# Patient Record
Sex: Male | Born: 1990 | Race: White | Hispanic: No | Marital: Single | Smoking: Never smoker
Health system: Southern US, Community
[De-identification: ages and names within clinical notes are randomized; demographics above are authoritative.]

## PROBLEM LIST (undated history)

## (undated) DIAGNOSIS — Z87898 Personal history of other specified conditions: Secondary | ICD-10-CM

## (undated) DIAGNOSIS — Z8673 Personal history of transient ischemic attack (TIA), and cerebral infarction without residual deficits: Secondary | ICD-10-CM

## (undated) DIAGNOSIS — Z8489 Family history of other specified conditions: Secondary | ICD-10-CM

## (undated) DIAGNOSIS — Z8661 Personal history of infections of the central nervous system: Secondary | ICD-10-CM

## (undated) DIAGNOSIS — L559 Sunburn, unspecified: Secondary | ICD-10-CM

## (undated) DIAGNOSIS — J353 Hypertrophy of tonsils with hypertrophy of adenoids: Secondary | ICD-10-CM

## (undated) HISTORY — PX: WISDOM TOOTH EXTRACTION: SHX21

---

## 2015-02-04 ENCOUNTER — Encounter (HOSPITAL_BASED_OUTPATIENT_CLINIC_OR_DEPARTMENT_OTHER): Payer: Self-pay | Admitting: *Deleted

## 2015-02-04 ENCOUNTER — Emergency Department (HOSPITAL_BASED_OUTPATIENT_CLINIC_OR_DEPARTMENT_OTHER): Payer: BLUE CROSS/BLUE SHIELD

## 2015-02-04 ENCOUNTER — Emergency Department (HOSPITAL_BASED_OUTPATIENT_CLINIC_OR_DEPARTMENT_OTHER)
Admission: EM | Admit: 2015-02-04 | Discharge: 2015-02-04 | Disposition: A | Discharge status: Home / Self Care | Payer: BLUE CROSS/BLUE SHIELD | Attending: Emergency Medicine | Admitting: Emergency Medicine

## 2015-02-04 DIAGNOSIS — J36 Peritonsillar abscess: Secondary | ICD-10-CM | POA: Insufficient documentation

## 2015-02-04 DIAGNOSIS — R52 Pain, unspecified: Secondary | ICD-10-CM

## 2015-02-04 DIAGNOSIS — J029 Acute pharyngitis, unspecified: Secondary | ICD-10-CM | POA: Diagnosis present

## 2015-02-04 MED ORDER — ONDANSETRON HCL 4 MG/2ML IJ SOLN
4.0000 mg | Freq: Once | INTRAMUSCULAR | Status: AC
  Administered 2015-02-04: 4 mg via INTRAVENOUS
  Filled 2015-02-04: qty 2

## 2015-02-04 MED ORDER — CLINDAMYCIN HCL 300 MG PO CAPS: 300.0000 mg | ORAL_CAPSULE | Freq: Four times a day (QID) | ORAL | Status: DC

## 2015-02-04 MED ORDER — NAPROXEN 500 MG PO TABS: ORAL_TABLET | ORAL | Status: DC

## 2015-02-04 MED ORDER — KETOROLAC TROMETHAMINE 30 MG/ML IJ SOLN
30.0000 mg | Freq: Once | INTRAMUSCULAR | Status: AC
  Administered 2015-02-04: 30 mg via INTRAVENOUS
  Filled 2015-02-04: qty 1

## 2015-02-04 MED ORDER — DEXAMETHASONE SODIUM PHOSPHATE 10 MG/ML IJ SOLN
20.0000 mg | Freq: Once | INTRAMUSCULAR | Status: AC
  Administered 2015-02-04: 20 mg via INTRAVENOUS
  Filled 2015-02-04: qty 2

## 2015-02-04 MED ORDER — CLINDAMYCIN PHOSPHATE 900 MG/50ML IV SOLN
900.0000 mg | Freq: Once | INTRAVENOUS | Status: AC
  Administered 2015-02-04: 900 mg via INTRAVENOUS
  Filled 2015-02-04: qty 50

## 2015-02-04 NOTE — ED Notes (Signed)
Dr. Palumbo at [facility] 

## 2015-02-04 NOTE — ED Notes (Signed)
Returns from xray, did fine standing per xray staff, VSS/improved, "feel better".

## 2015-02-04 NOTE — ED Provider Notes (Signed)
648 on reassessment no shift of the uvula.  Left tonsil  Has retracted approximately 25%.  Widely patent airway.  PO challenged successfully .  Stable for discharge  Steffie Waggoner, MD 02/04/15 [phone]

## 2015-02-04 NOTE — ED Provider Notes (Signed)
CSN: [id number]     Arrival date & time 02/04/15  4098 History   First MD Initiated Contact with Patient 02/04/15 0500     Chief Complaint  Patient presents with  . Sore Throat     (Consider location/radiation/quality/duration/timing/severity/associated sxs/prior Treatment) Patient is a 24 y.o. male presenting with pharyngitis. The history is provided by the patient.  Sore Throat This is a new problem. The current episode started yesterday. The problem occurs constantly. The problem has not changed since onset.Pertinent negatives include no chest pain, no abdominal pain, no headaches and no shortness of breath. Nothing aggravates the symptoms. Nothing relieves the symptoms. He has tried nothing for the symptoms. The treatment provided no relief.  Apparently, something similar happened 2 months ago and patient was seen at urgent care and described amoxicillin.  No difficulty opening the mouth, nor swallowing no handling own secretions.    History reviewed. No pertinent past medical history. History reviewed. No pertinent past surgical history. History reviewed. No pertinent family history. History  Substance Use Topics  . Smoking status: Never Smoker   . Smokeless tobacco: Not on file  . Alcohol Use: No    Review of Systems  HENT: Positive for sore throat and voice change. Negative for dental problem, drooling and facial swelling.   Respiratory: Negative for shortness of breath.   Cardiovascular: Negative for chest pain.  Gastrointestinal: Negative for abdominal pain.  Neurological: Negative for headaches.  All other systems reviewed and are negative.     Allergies  Sulfa antibiotics  Home Medications   Prior to Admission medications   Not on File   BP 131/69 mmHg  Pulse 99  Temp(Src) 99.4 F (37.4 C) (Oral)  Resp 18  Ht 6' (1.829 m)  Wt 211 lb (95.709 kg)  BMI 28.61 kg/m2  SpO2 99% Physical Exam  Constitutional: He is oriented to person, place, and time. He  appears well-developed and well-nourished.  HENT:  Head: Normocephalic and atraumatic.  Mouth/Throat: No trismus in the jaw. Uvula swelling present. Tonsillar abscesses present.  Eyes: Conjunctivae and EOM are normal. Pupils are equal, round, and reactive to light.  Neck: Normal range of motion. Neck supple. No tracheal deviation present.  No pain with displacement of the trachea  Cardiovascular: Normal rate, regular rhythm and intact distal pulses.   Pulmonary/Chest: Effort normal and breath sounds normal. No stridor. No respiratory distress. He has no wheezes. He has no rales.  Abdominal: Soft. Bowel sounds are normal. There is no tenderness. There is no rebound and no guarding.  Musculoskeletal: Normal range of motion.  Lymphadenopathy:    He has no cervical adenopathy.  Neurological: He is alert and oriented to person, place, and time.  Skin: Skin is warm and dry.  Psychiatric: He has a normal mood and affect.    ED Course  Procedures (including critical care time) Labs Review Labs Reviewed - No data to display  Imaging Review No results found.   EKG Interpretation None      MDM   Final diagnoses:  None    507 am case d/w Dr. Suszanne Conners, 900 mg of clindamycin, 20 mg dexamethasone call in am to be seen early this week in office.  Prescribe clindamycin 300 QID x 10 days  Medications  ketorolac (TORADOL) 30 MG/ML injection 30 mg (not administered)  dexamethasone (DECADRON) injection 20 mg (not administered)  clindamycin (CLEOCIN) IVPB 900 mg (not administered)  PO challenged successfully in the ED without difficulty.    Shared decision  making: medication and treatment course discussed with patient and family who express understanding and agree to follow up strict return   Shoni Quijas, MD 02/04/15 [phone]

## 2015-02-04 NOTE — ED Notes (Addendum)
Xray delayed d/t vagal response after IV start, BP low, diaphoretic, pale. EDP aware, pt "starting to feel a little better". [facility]

## 2015-02-04 NOTE — ED Notes (Signed)
EDP in to speak with pt/family, pt up to b/r, steady gait, tolerated well, VSS/ improved, alert, NAD, calm, tolerating POs.

## 2015-02-04 NOTE — ED Notes (Signed)
C/o sore throat, swelling present (tonsillar and neck), L>R, onset yesterday, h/o similar intermitently over last 1-2 months, finished amox ~ 1 month ago, seen by an urgent care at that time, has not seen an ENT for same, pt of [facility] in [place].  (denies: fever, nvd, bleeding, coughing, weakness, dizziness or other sx), admits to some sob (no dyspnea noted, LS CTA, handling secretions, tolerating POs). No meds PTA. Pt is Jehovah's Witness, mentions "no blood".

## 2015-02-20 ENCOUNTER — Other Ambulatory Visit: Payer: Self-pay | Admitting: Otolaryngology

## 2015-03-15 DIAGNOSIS — J353 Hypertrophy of tonsils with hypertrophy of adenoids: Secondary | ICD-10-CM

## 2015-03-15 HISTORY — DX: Hypertrophy of tonsils with hypertrophy of adenoids: J35.3

## 2015-03-28 ENCOUNTER — Encounter (HOSPITAL_BASED_OUTPATIENT_CLINIC_OR_DEPARTMENT_OTHER): Payer: Self-pay | Admitting: *Deleted

## 2015-03-28 DIAGNOSIS — L559 Sunburn, unspecified: Secondary | ICD-10-CM

## 2015-03-28 HISTORY — DX: Sunburn, unspecified: L55.9

## 2015-04-02 ENCOUNTER — Ambulatory Visit (HOSPITAL_BASED_OUTPATIENT_CLINIC_OR_DEPARTMENT_OTHER): Payer: BLUE CROSS/BLUE SHIELD | Admitting: Anesthesiology

## 2015-04-02 ENCOUNTER — Encounter (HOSPITAL_BASED_OUTPATIENT_CLINIC_OR_DEPARTMENT_OTHER): Payer: Self-pay | Admitting: *Deleted

## 2015-04-02 ENCOUNTER — Ambulatory Visit (HOSPITAL_BASED_OUTPATIENT_CLINIC_OR_DEPARTMENT_OTHER)
Admission: RE | Admit: 2015-04-02 | Discharge: 2015-04-02 | Disposition: A | Discharge status: Home / Self Care | Payer: BLUE CROSS/BLUE SHIELD | Source: Ambulatory Visit | Attending: Otolaryngology | Admitting: Otolaryngology

## 2015-04-02 ENCOUNTER — Encounter (HOSPITAL_BASED_OUTPATIENT_CLINIC_OR_DEPARTMENT_OTHER)
Admission: RE | Disposition: A | Discharge status: Home / Self Care | Payer: Self-pay | Source: Ambulatory Visit | Attending: Otolaryngology

## 2015-04-02 DIAGNOSIS — J312 Chronic pharyngitis: Secondary | ICD-10-CM | POA: Diagnosis not present

## 2015-04-02 DIAGNOSIS — J3501 Chronic tonsillitis: Secondary | ICD-10-CM | POA: Diagnosis not present

## 2015-04-02 DIAGNOSIS — J353 Hypertrophy of tonsils with hypertrophy of adenoids: Secondary | ICD-10-CM | POA: Insufficient documentation

## 2015-04-02 DIAGNOSIS — J358 Other chronic diseases of tonsils and adenoids: Secondary | ICD-10-CM | POA: Diagnosis not present

## 2015-04-02 HISTORY — DX: Family history of other specified conditions: Z84.89

## 2015-04-02 HISTORY — DX: Personal history of infections of the central nervous system: Z86.61

## 2015-04-02 HISTORY — DX: Personal history of transient ischemic attack (TIA), and cerebral infarction without residual deficits: Z86.73

## 2015-04-02 HISTORY — DX: Hypertrophy of tonsils with hypertrophy of adenoids: J35.3

## 2015-04-02 HISTORY — DX: Personal history of other specified conditions: Z87.898

## 2015-04-02 HISTORY — DX: Sunburn, unspecified: L55.9

## 2015-04-02 HISTORY — PX: TONSILLECTOMY: SHX5217

## 2015-04-02 LAB — POCT HEMOGLOBIN-HEMACUE: Hemoglobin: 16.8 g/dL (ref 13.0–17.0)

## 2015-04-02 SURGERY — TONSILLECTOMY
Anesthesia: General | Laterality: Bilateral

## 2015-04-02 MED ORDER — LACTATED RINGERS IV SOLN
INTRAVENOUS | Status: DC
  Administered 2015-04-02 (×3): via INTRAVENOUS

## 2015-04-02 MED ORDER — GLYCOPYRROLATE 0.2 MG/ML IJ SOLN
0.2000 mg | Freq: Once | INTRAMUSCULAR | Status: AC | PRN
  Administered 2015-04-02: 0.2 mg via INTRAVENOUS

## 2015-04-02 MED ORDER — OXYCODONE HCL 5 MG/5ML PO SOLN: 5.0000 mg | Freq: Four times a day (QID) | ORAL | Status: AC | PRN

## 2015-04-02 MED ORDER — ONDANSETRON HCL 4 MG/2ML IJ SOLN
INTRAMUSCULAR | Status: DC | PRN
  Administered 2015-04-02: 4 mg via INTRAVENOUS

## 2015-04-02 MED ORDER — SUCCINYLCHOLINE CHLORIDE 20 MG/ML IJ SOLN
INTRAMUSCULAR | Status: DC | PRN
  Administered 2015-04-02: 100 mg via INTRAVENOUS

## 2015-04-02 MED ORDER — MIDAZOLAM HCL 2 MG/2ML IJ SOLN
INTRAMUSCULAR | Status: AC
  Filled 2015-04-02: qty 2

## 2015-04-02 MED ORDER — BACITRACIN 500 UNIT/GM EX OINT
TOPICAL_OINTMENT | CUTANEOUS | Status: DC | PRN
  Administered 2015-04-02: 1 via TOPICAL

## 2015-04-02 MED ORDER — SODIUM CHLORIDE 0.9 % IR SOLN
Status: DC | PRN
Start: 2015-04-02 — End: 2015-04-02
  Administered 2015-04-02: 500 mL

## 2015-04-02 MED ORDER — AMOXICILLIN 400 MG/5ML PO SUSR: 800.0000 mg | Freq: Two times a day (BID) | ORAL | Status: AC

## 2015-04-02 MED ORDER — DEXAMETHASONE SODIUM PHOSPHATE 4 MG/ML IJ SOLN
INTRAMUSCULAR | Status: DC | PRN
  Administered 2015-04-02: 10 mg via INTRAVENOUS

## 2015-04-02 MED ORDER — PROMETHAZINE HCL 25 MG/ML IJ SOLN
6.2500 mg | INTRAMUSCULAR | Status: DC | PRN
Start: 2015-04-02 — End: 2015-04-02

## 2015-04-02 MED ORDER — ONDANSETRON HCL 4 MG/2ML IJ SOLN
4.0000 mg | Freq: Once | INTRAMUSCULAR | Status: AC
  Administered 2015-04-02: 4 mg via INTRAVENOUS

## 2015-04-02 MED ORDER — ONDANSETRON HCL 4 MG/2ML IJ SOLN
INTRAMUSCULAR | Status: AC
  Filled 2015-04-02: qty 2

## 2015-04-02 MED ORDER — MIDAZOLAM HCL 2 MG/2ML IJ SOLN
1.0000 mg | INTRAMUSCULAR | Status: DC | PRN
Start: 2015-04-02 — End: 2015-04-02
  Administered 2015-04-02: 2 mg via INTRAVENOUS

## 2015-04-02 MED ORDER — SCOPOLAMINE 1 MG/3DAYS TD PT72
MEDICATED_PATCH | TRANSDERMAL | Status: AC
  Filled 2015-04-02: qty 1

## 2015-04-02 MED ORDER — PROPOFOL 10 MG/ML IV BOLUS
INTRAVENOUS | Status: DC | PRN
  Administered 2015-04-02: 200 mg via INTRAVENOUS

## 2015-04-02 MED ORDER — LIDOCAINE HCL (CARDIAC) 20 MG/ML IV SOLN
INTRAVENOUS | Status: DC | PRN
  Administered 2015-04-02: 50 mg via INTRAVENOUS

## 2015-04-02 MED ORDER — FENTANYL CITRATE (PF) 100 MCG/2ML IJ SOLN
INTRAMUSCULAR | Status: AC
  Filled 2015-04-02: qty 4

## 2015-04-02 MED ORDER — OXYMETAZOLINE HCL 0.05 % NA SOLN
NASAL | Status: DC | PRN
  Administered 2015-04-02: 1

## 2015-04-02 MED ORDER — SCOPOLAMINE 1 MG/3DAYS TD PT72
1.0000 | MEDICATED_PATCH | Freq: Once | TRANSDERMAL | Status: DC | PRN
  Administered 2015-04-02: 1.5 mg via TRANSDERMAL

## 2015-04-02 MED ORDER — HYDROMORPHONE HCL 1 MG/ML IJ SOLN
INTRAMUSCULAR | Status: AC
  Filled 2015-04-02: qty 1

## 2015-04-02 MED ORDER — FENTANYL CITRATE (PF) 100 MCG/2ML IJ SOLN
50.0000 ug | INTRAMUSCULAR | Status: DC | PRN
  Administered 2015-04-02: 100 ug via INTRAVENOUS

## 2015-04-02 MED ORDER — HYDROMORPHONE HCL 1 MG/ML IJ SOLN
0.2500 mg | INTRAMUSCULAR | Status: AC | PRN
  Administered 2015-04-02 (×4): 0.5 mg via INTRAVENOUS

## 2015-04-02 SURGICAL SUPPLY — 30 items
BANDAGE COBAN STERILE 2 (GAUZE/BANDAGES/DRESSINGS) IMPLANT
CANISTER SUCT 1200ML W/VALVE (MISCELLANEOUS) ×3 IMPLANT
CATH ROBINSON RED A/P 10FR (CATHETERS) IMPLANT
CATH ROBINSON RED A/P 14FR (CATHETERS) ×3 IMPLANT
COAGULATOR SUCT 6 FR SWTCH (ELECTROSURGICAL)
COAGULATOR SUCT SWTCH 10FR 6 (ELECTROSURGICAL) IMPLANT
COVER MAYO STAND STRL (DRAPES) ×3 IMPLANT
ELECT REM PT RETURN 9FT ADLT (ELECTROSURGICAL) ×3
ELECT REM PT RETURN 9FT PED (ELECTROSURGICAL)
ELECTRODE REM PT RETRN 9FT PED (ELECTROSURGICAL) IMPLANT
ELECTRODE REM PT RTRN 9FT ADLT (ELECTROSURGICAL) ×1 IMPLANT
GLOVE BIO SURGEON STRL SZ7.5 (GLOVE) ×3 IMPLANT
GOWN STRL REUS W/ TWL LRG LVL3 (GOWN DISPOSABLE) ×2 IMPLANT
GOWN STRL REUS W/TWL LRG LVL3 (GOWN DISPOSABLE) ×4
IV NS 500ML (IV SOLUTION) ×2
IV NS 500ML BAXH (IV SOLUTION) ×1 IMPLANT
MARKER SKIN DUAL TIP RULER LAB (MISCELLANEOUS) IMPLANT
NS IRRIG 1000ML POUR BTL (IV SOLUTION) ×3 IMPLANT
SHEET MEDIUM DRAPE 40X70 STRL (DRAPES) ×3 IMPLANT
SOLUTION BUTLER CLEAR DIP (MISCELLANEOUS) ×3 IMPLANT
SPONGE GAUZE 4X4 12PLY STER LF (GAUZE/BANDAGES/DRESSINGS) ×3 IMPLANT
SPONGE TONSIL 1 RF SGL (DISPOSABLE) IMPLANT
SPONGE TONSIL 1.25 RF SGL STRG (GAUZE/BANDAGES/DRESSINGS) ×3 IMPLANT
SYR BULB 3OZ (MISCELLANEOUS) IMPLANT
TOWEL OR 17X24 6PK STRL BLUE (TOWEL DISPOSABLE) ×3 IMPLANT
TUBE CONNECTING 20'X1/4 (TUBING) ×1
TUBE CONNECTING 20X1/4 (TUBING) ×2 IMPLANT
TUBE SALEM SUMP 12R W/ARV (TUBING) IMPLANT
TUBE SALEM SUMP 16 FR W/ARV (TUBING) ×3 IMPLANT
WAND COBLATOR 70 EVAC XTRA (SURGICAL WAND) ×3 IMPLANT

## 2015-04-02 NOTE — Op Note (Signed)
DATE OF PROCEDURE:  04/02/2015                              OPERATIVE REPORT  SURGEON:  Newman PiesSu Shiryl Ruddy, MD  PREOPERATIVE DIAGNOSES: 1. Adenotonsillar hypertrophy. 2. Chronic tonsillitis and pharyngitis  POSTOPERATIVE DIAGNOSES: 1. Adenotonsillar hypertrophy. 2. Chronic tonsillitis and pharyngitis  PROCEDURE PERFORMED:  Adenotonsillectomy.  ANESTHESIA:  General endotracheal tube anesthesia.  COMPLICATIONS:  None.  ESTIMATED BLOOD LOSS:  Minimal.  INDICATION FOR PROCEDURE:  Nicholas Mcmahon is a 24 y.o. male with a history of chronic tonsillitis/pharyngitis and halitosis.  According to the patient, he has been experiencing chronic throat discomfort with halitosis for several years. The patient continues to be symptomatic despite medical treatments. He recently had an episode of severe peritonsillar abscess. On examination, the patient was noted to have bilateral cryptic tonsils, with numerous tonsilloliths. Based on the above findings, the decision was made for the patient to undergo the adenotonsillectomy procedure. Likelihood of success in reducing symptoms was also discussed.  The risks, benefits, alternatives, and details of the procedure were discussed with the mother.  Questions were invited and answered.  Informed consent was obtained.  DESCRIPTION:  The patient was taken to the operating room and placed supine on the operating table.  General endotracheal tube anesthesia was administered by the anesthesiologist.  The patient was positioned and prepped and draped in a standard fashion for adenotonsillectomy.  A Crowe-Davis mouth gag was inserted into the oral cavity for exposure. 3+ cryptic tonsils were noted bilaterally.  No bifidity was noted.  Indirect mirror examination of the nasopharynx revealed mild adenoid hypertrophy. The adenoid was ablated with the Coblator device. Hemostasis was achieved with the Coblator device.  The right tonsil was then grasped with a straight Allis clamp and  retracted medially.  It was resected free from the underlying pharyngeal constrictor muscles with the Coblator device.  The same procedure was repeated on the left side without exception.  The surgical sites were copiously irrigated.  The mouth gag was removed.  The care of the patient was turned over to the anesthesiologist.  The patient was awakened from anesthesia without difficulty.  The patient was extubated and transferred to the recovery room in good condition.  OPERATIVE FINDINGS:  Adenotonsillar hypertrophy.  SPECIMEN:  Bilateral tonsils  FOLLOWUP CARE:  The patient will be discharged home once awake and alert.  He will be placed on amoxicillin 800 mg p.o. b.i.d. for 5 days, and Roxicet 5-5210ml po q 4 hours for postop pain control.   The patient will follow up in my office in approximately 2 weeks.  Vinia Jemmott,SUI W 04/02/2015 8:56 AM

## 2015-04-02 NOTE — Anesthesia Postprocedure Evaluation (Signed)
Anesthesia Post Note  Patient: Nicholas Mcmahon  Procedure(s) Performed: Procedure(s) (LRB): BILATERAL TONSILLECTOMY  (Bilateral)  Anesthesia type: general  Patient location: PACU  Post pain: Pain level controlled  Post assessment: Patient's Cardiovascular Status Stable  Last Vitals:  Filed Vitals:   04/02/15 0930  BP: 112/73  Pulse: 62  Temp:   Resp: 11    Post vital signs: Reviewed and stable  Level of consciousness: sedated  Complications: No apparent anesthesia complications

## 2015-04-02 NOTE — H&P (Signed)
Cc: Recurrent tonsillitis, peritonsillar abscess  HPI: The patient is a 24 year old male who returns today for his follow-up evaluation.  He was last seen 1 week ago.  At that time, his peritonsillar abscess had significantly improved. He was instructed to continue his clindamycin. According to the patient, his sore throat has mostly resolved.  Currently he is tolerating oral intake well. The patient reports he typically has 3-4 episodes of tonsillitis a year for the past 5+ years.  It has resulted in significant compromise of his quality of life.  He is interested in a more permanent solution to his recurrent tonsillitis/pharyngitis.    General: Communicates without difficulty, well nourished, no acute distress. Head: Normocephalic, no evidence injury, no tenderness, facial buttresses intact without stepoff. Eyes: PERRL, EOMI. No scleral icterus, conjunctivae clear. Neuro: CN II exam reveals vision grossly intact.  No nystagmus at any point of gaze. Ears: Auricles well formed without lesions.  Ear canals are intact without mass or lesion.  No erythema or edema is appreciated.  The TMs are intact without fluid. Nose: External evaluation reveals normal support and skin without lesions.  Dorsum is intact.  Anterior rhinoscopy reveals healthy pink mucosa over anterior aspect of inferior turbinates and intact septum.  No purulence noted. Oral:  Oral cavity and oropharynx are intact, symmetric, without erythema or edema.  Mucosa is moist without lesions. Tonsils 3+ and cryptic. Neck: Full range of motion without pain.  There is no significant lymphadenopathy.  No masses palpable.  Thyroid bed within normal limits to palpation.  Parotid glands and submandibular glands equal bilaterally without mass.  Trachea is midline. Neuro:  CN 2-12 grossly intact. Gait normal. Vestibular: No nystagmus at any point of gaze. The cerebellar examination is unremarkable.    Assessment 1.  The patient's acute  tonsillitis/peritonsillar abscess has resolved.  However, he continues to have significant tonsillar hypertrophy.  3+ cryptic tonsils are noted bilaterally.   2.  The patient has a history of frequent recurrent tonsillitis/pharyngitis.  He has had 3-4 episodes a year for more than 5 years.   Plan  1.  The physical exam findings are reviewed with the patient and his parents.   2.  The patient meets the criteria for undergoing adenotonsillectomy to treat his recurrent tonsillitis/pharyngitis.   The risks, benefits, alternatives and details of the procedure are reviewed with the patient.   3.  He would like to proceed with the procedure.  We will schedule the procedure in accordance with the family's schedule.

## 2015-04-02 NOTE — Anesthesia Procedure Notes (Signed)
Procedure Name: Intubation Date/Time: 04/02/2015 8:07 AM Performed by: Caren MacadamARTER, Gustavus Haskin W Pre-anesthesia Checklist: Patient identified, Emergency Drugs available, Suction available and Patient being monitored Patient Re-evaluated:Patient Re-evaluated prior to inductionOxygen Delivery Method: Circle System Utilized Preoxygenation: Pre-oxygenation with 100% oxygen Intubation Type: IV induction Ventilation: Mask ventilation without difficulty Laryngoscope Size: Miller and 2 Grade View: Grade I Tube type: Oral Tube size: 7.0 mm Number of attempts: 1 Airway Equipment and Method: Stylet and Oral airway Placement Confirmation: ETT inserted through vocal cords under direct vision,  positive ETCO2 and breath sounds checked- equal and bilateral Secured at: 22 cm Tube secured with: Tape Dental Injury: Teeth and Oropharynx as per pre-operative assessment

## 2015-04-02 NOTE — Transfer of Care (Signed)
Immediate Anesthesia Transfer of Care Note  Patient: Nicholas Mcmahon  Procedure(s) Performed: Procedure(s): BILATERAL TONSILLECTOMY  (Bilateral)  Patient Location: PACU  Anesthesia Type:General  Level of Consciousness: awake and alert   Airway & Oxygen Therapy: Patient Spontanous Breathing and Patient connected to face mask oxygen  Post-op Assessment: Report given to RN and Post -op Vital signs reviewed and stable  Post vital signs: Reviewed and stable  Last Vitals:  Filed Vitals:   04/02/15 0710  BP: 136/78  Pulse: 57  Temp: 36.9 C  Resp: 20    Complications: No apparent anesthesia complications

## 2015-04-02 NOTE — Discharge Instructions (Addendum)
[facility] °Postoperative Instructions for Tonsillectomy & Adenoidectomy (T&A) °Activity °Restrict activity at home for the first two days, resting as much as possible. Light indoor activity is best. You may usually return to school or work within a week but void strenuous activity and sports for two weeks. Sleep with your head elevated on 2-3 pillows for 3-4 days to help decrease swelling. °Diet °Due to tissue swelling and throat discomfort, you may have little desire to drink for several days. However fluids are very important to prevent dehydration. You will find that non-acidic juices, soups, popsicles, Jell-O, custard, puddings, and any soft or mashed foods taken in small quantities can be swallowed fairly easily. Try to increase your fluid and food intake as the discomfort subsides. It is recommended that a child receive 1-1/2 quarts of fluid in a 24-hour period. Adult require twice this amount.  °Discomfort °Your sore throat may be relieved by applying an ice collar to your neck and/or by taking Tylenol®. You may experience an earache, which is due to referred pain from the throat. Referred ear pain is commonly felt at night when trying to rest. ° °Bleeding                        Although rare, there is risk of having some bleeding during the first 2 weeks after having a T&A. This usually happens between days 7-10 postoperatively. If you or your child should have any bleeding, try to remain calm. We recommend sitting up quietly in a chair and gently spitting out the blood into a bowl. For adults, gargling gently with ice water may help. If the bleeding does not stop after a short time (5 minutes), is more than 1 teaspoonful, or if you become worried, please call our office at [phone] or go directly to the nearest hospital emergency room. Do not eat or drink anything prior to going to the hospital as you may need to be taken to the operating room in order to control the bleeding. °GENERAL  CONSIDERATIONS °1. Brush your teeth regularly. Avoid mouthwashes and gargles for three weeks. You may gargle gently with warm salt-water as necessary or spray with Chloraseptic®. You may make salt-water by placing 2 teaspoons of table salt into a quart of fresh water. Warm the salt-water in a microwave to a luke warm temperature.  °2. Avoid exposure to colds and upper respiratory infections if possible.  °3. If you look into a mirror or into your child's mouth, you will see white-gray patches in the back of the throat. This is normal after having a T&A and is like a scab that forms on the skin after an abrasion. It will disappear once the back of the throat heals completely. However, it may cause a noticeable odor; this too will disappear with time. Again, warm salt-water gargles may be used to help keep the throat clean and promote healing.  °4. You may notice a temporary change in voice quality, such as a higher pitched voice or a nasal sound, until healing is complete. This may last for 1-2 weeks and should resolve.  °5. Do not take or give you child any medications that we have not prescribed or recommended.  °6. Snoring may occur, especially at night, for the first week after a T&A. It is due to swelling of the soft palate and will usually resolve.  °Please call our office at [phone] if you have any questions.   ° ° ° °  Post Anesthesia Home Care Instructions ° °Activity: °Get plenty of rest for the remainder of the day. A responsible adult should stay with you for 24 hours following the procedure.  °For the next 24 hours, DO NOT: °-Drive a car °-Operate machinery °-Drink alcoholic beverages °-Take any medication unless instructed by your physician °-Make any legal decisions or sign important papers. ° °Meals: °Start with liquid foods such as gelatin or soup. Progress to regular foods as tolerated. Avoid greasy, spicy, heavy foods. If nausea and/or vomiting occur, drink only clear liquids until the nausea  and/or vomiting subsides. Call your physician if vomiting continues. ° °Special Instructions/Symptoms: °Your throat may feel dry or sore from the anesthesia or the breathing tube placed in your throat during surgery. If this causes discomfort, gargle with warm salt water. The discomfort should disappear within 24 hours. ° °If you had a scopolamine patch placed behind your ear for the management of post- operative nausea and/or vomiting: ° °1. The medication in the patch is effective for 72 hours, after which it should be removed.  Wrap patch in a tissue and discard in the trash. Wash hands thoroughly with soap and water. °2. You may remove the patch earlier than 72 hours if you experience unpleasant side effects which may include dry mouth, dizziness or visual disturbances. °3. Avoid touching the patch. Wash your hands with soap and water after contact with the patch. °  ° °

## 2015-04-02 NOTE — Anesthesia Preprocedure Evaluation (Signed)
Anesthesia Evaluation  Patient identified by MRN, date of birth, ID band Patient awake    Reviewed: Allergy & Precautions, H&P , NPO status , Patient's Chart, lab work & pertinent test results  Airway Mallampati: II  TM Distance: >3 FB Neck ROM: full    Dental  (+) Teeth Intact, Dental Advidsory Given   Pulmonary neg pulmonary ROS,  breath sounds clear to auscultation        Cardiovascular negative cardio ROS  Rhythm:regular Rate:Normal     Neuro/Psych negative neurological ROS  negative psych ROS   GI/Hepatic negative GI ROS, Neg liver ROS,   Endo/Other  negative endocrine ROS  Renal/GU negative Renal ROS     Musculoskeletal   Abdominal   Peds  Hematology   Anesthesia Other Findings   Reproductive/Obstetrics negative OB ROS                             Anesthesia Physical Anesthesia Plan  ASA: I  Anesthesia Plan: General ETT   Post-op Pain Management:    Induction:   Airway Management Planned:   Additional Equipment:   Intra-op Plan:   Post-operative Plan:   Informed Consent: I have reviewed the patients History and Physical, chart, labs and discussed the procedure including the risks, benefits and alternatives for the proposed anesthesia with the patient or authorized representative who has indicated his/her understanding and acceptance.   Dental Advisory Given  Plan Discussed with: Anesthesiologist, CRNA and Surgeon  Anesthesia Plan Comments:         Anesthesia Quick Evaluation

## 2015-04-03 ENCOUNTER — Encounter (HOSPITAL_BASED_OUTPATIENT_CLINIC_OR_DEPARTMENT_OTHER): Payer: Self-pay | Admitting: Otolaryngology

## 2016-06-17 ENCOUNTER — Emergency Department (HOSPITAL_BASED_OUTPATIENT_CLINIC_OR_DEPARTMENT_OTHER): Payer: BLUE CROSS/BLUE SHIELD

## 2016-06-17 ENCOUNTER — Emergency Department (HOSPITAL_BASED_OUTPATIENT_CLINIC_OR_DEPARTMENT_OTHER)
Admission: EM | Admit: 2016-06-17 | Discharge: 2016-06-17 | Disposition: A | Discharge status: Home / Self Care | Payer: BLUE CROSS/BLUE SHIELD | Attending: Emergency Medicine | Admitting: Emergency Medicine

## 2016-06-17 ENCOUNTER — Encounter (HOSPITAL_BASED_OUTPATIENT_CLINIC_OR_DEPARTMENT_OTHER): Payer: Self-pay | Admitting: Emergency Medicine

## 2016-06-17 DIAGNOSIS — S43014A Anterior dislocation of right humerus, initial encounter: Secondary | ICD-10-CM | POA: Insufficient documentation

## 2016-06-17 DIAGNOSIS — S43004A Unspecified dislocation of right shoulder joint, initial encounter: Secondary | ICD-10-CM

## 2016-06-17 DIAGNOSIS — Z79899 Other long term (current) drug therapy: Secondary | ICD-10-CM | POA: Insufficient documentation

## 2016-06-17 DIAGNOSIS — M21821 Other specified acquired deformities of right upper arm: Secondary | ICD-10-CM

## 2016-06-17 DIAGNOSIS — Y999 Unspecified external cause status: Secondary | ICD-10-CM | POA: Insufficient documentation

## 2016-06-17 DIAGNOSIS — X501XXA Overexertion from prolonged static or awkward postures, initial encounter: Secondary | ICD-10-CM | POA: Insufficient documentation

## 2016-06-17 DIAGNOSIS — Y929 Unspecified place or not applicable: Secondary | ICD-10-CM | POA: Insufficient documentation

## 2016-06-17 DIAGNOSIS — Y939 Activity, unspecified: Secondary | ICD-10-CM | POA: Insufficient documentation

## 2016-06-17 MED ORDER — METHOCARBAMOL 1000 MG/10ML IJ SOLN
1000.0000 mg | Freq: Once | INTRAMUSCULAR | Status: AC
Start: 2016-06-17 — End: 2016-06-17
  Administered 2016-06-17: 1000 mg via INTRAVENOUS
  Filled 2016-06-17: qty 10

## 2016-06-17 MED ORDER — KETOROLAC TROMETHAMINE 30 MG/ML IJ SOLN
30.0000 mg | Freq: Once | INTRAMUSCULAR | Status: AC
  Administered 2016-06-17: 30 mg via INTRAVENOUS
  Filled 2016-06-17: qty 1

## 2016-06-17 MED ORDER — HYDROCODONE-ACETAMINOPHEN 5-325 MG PO TABS: 1.0000 | ORAL_TABLET | Freq: Four times a day (QID) | ORAL | 0 refills | Status: AC | PRN

## 2016-06-17 MED ORDER — FENTANYL CITRATE (PF) 100 MCG/2ML IJ SOLN
100.0000 ug | Freq: Once | INTRAMUSCULAR | Status: AC
  Administered 2016-06-17: 100 ug via INTRAVENOUS
  Filled 2016-06-17: qty 2

## 2016-06-17 MED ORDER — PROPOFOL 10 MG/ML IV BOLUS
1.5000 mg/kg | Freq: Once | INTRAVENOUS | Status: AC
  Administered 2016-06-17: 100 mg via INTRAVENOUS

## 2016-06-17 MED ORDER — PROPOFOL 10 MG/ML IV BOLUS
INTRAVENOUS | Status: AC
  Administered 2016-06-17: 100 mg via INTRAVENOUS
  Filled 2016-06-17: qty 40

## 2016-06-17 NOTE — ED Provider Notes (Signed)
[facility] Provider Note   CSN: [id number] Arrival date & time: 06/17/16  0359     History   Chief Complaint Chief Complaint  Patient presents with  . Shoulder Injury    HPI Nicholas Mcmahon is a 25 y.o. male.  The history is provided by the patient.  Shoulder Injury  This is a new problem. The current episode started less than 1 hour ago. The problem occurs constantly. The problem has not changed since onset.Pertinent negatives include no chest pain, no abdominal pain, no headaches and no shortness of breath. Nothing aggravates the symptoms. Nothing relieves the symptoms. He has tried acetaminophen for the symptoms. The treatment provided no relief.  Patient states he rolled over in bed onto his right shoulder and felt immediate pain.  States he has never injured his shoulder in the past.  Did not fall out of bed.  Last ingestion was water 30 minutes PTA  Past Medical History:  Diagnosis Date  . Family history of adverse reaction to anesthesia    states mother woke up during anesthesia  . History of seizure age 37 mos.   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 History of stroke age 75 mos.   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 Sunburn 03/28/2015   back  . Tonsillar and adenoid hypertrophy 03/2015    There are no active problems to display for this patient.   Past Surgical History:  Procedure Laterality Date  . TONSILLECTOMY Bilateral 04/02/2015   Procedure: BILATERAL TONSILLECTOMY ;  Surgeon: Newman Pies, MD;  Location: [facility];  Service: ENT;  Laterality: Bilateral;  . WISDOM TOOTH EXTRACTION         Home Medications    Prior to Admission medications   Medication Sig Start Date End Date Taking? Authorizing Provider  loratadine (CLARITIN) 10 MG tablet Take 10 mg by mouth daily.   Yes Historical Provider, MD    oxyCODONE (ROXICODONE) 5 MG/5ML solution Take 5-10 mLs (5-10 mg total) by mouth every 6 (six) hours as needed for severe pain. 04/02/15   Newman Pies, MD    Family History Family History  Problem Relation Age of Onset  . Anesthesia problems Mother     wakes up during anesthesia    Social History Social History  Substance Use Topics  . Smoking status: Never Smoker  . Smokeless tobacco: Never Used  . Alcohol use Yes     Comment: occaionally     Allergies   Sulfa antibiotics   Review of Systems Review of Systems  Respiratory: Negative for shortness of breath.   Cardiovascular: Negative for chest pain.  Gastrointestinal: Negative for abdominal pain.  Musculoskeletal: Positive for arthralgias.  Neurological: Negative for weakness, numbness and headaches.  All other systems reviewed and are negative.    Physical Exam Updated Vital Signs BP 122/76 (BP Location: Right Arm)   Pulse 78   Temp 98.5 F (36.9 C) (Oral)   Resp 20   Ht 5\' 11"  (1.803 m)   Wt 180 lb (81.6 kg)   SpO2 99%   BMI 25.10 kg/m   Physical Exam  Constitutional: He appears well-developed and well-nourished.  HENT:  Head: Normocephalic and atraumatic.  Mouth/Throat: No oropharyngeal exudate.  Eyes: Pupils are equal, round, and reactive to light.  Neck: Normal range of motion. Neck  supple.  Cardiovascular: Normal rate, regular rhythm and intact distal pulses.   Pulmonary/Chest: Effort normal and breath sounds normal. He has no wheezes. He has no rales.  Abdominal: Soft. Bowel sounds are normal. He exhibits no mass. There is no tenderness. There is no guarding.  Musculoskeletal: He exhibits deformity.       Right shoulder: He exhibits decreased range of motion and deformity. He exhibits no effusion, no spasm and normal pulse.  Skin: Skin is warm and dry. Capillary refill takes less than 2 seconds.  Psychiatric: Thought content normal.     ED Treatments / Results   Vitals:   06/17/16 0407  BP:  122/76  Pulse: 78  Resp: 20  Temp: 98.5 F (36.9 C)    Radiology Dg Shoulder Right  Result Date: 06/17/2016 CLINICAL DATA:  Right shoulder pain after rolling over in bed. EXAM: RIGHT SHOULDER - 2+ VIEW COMPARISON:  None. FINDINGS: Anterior dislocation of the right shoulder with anterior and inferior subluxation of the humeral head with respect to the glenoid. Small cortical defect along the lateral humeral head may represent a Hill-Sachs deformity. Glenoid appears intact. Coracoclavicular and acromioclavicular spaces are maintained. IMPRESSION: Anterior dislocation of the right shoulder with probable Hill-Sachs deformity. Electronically Signed   By: Burman Nieves M.D.   On: 06/17/2016 04:29    Procedures Reduction of dislocation Date/Time: 06/17/2016 5:23 AM Performed by: Cy Blamer Authorized by: Cy Blamer  Consent: Verbal consent obtained. Written consent obtained. Risks and benefits: risks, benefits and alternatives were discussed Consent given by: patient Patient understanding: patient states understanding of the procedure being performed Patient consent: the patient's understanding of the procedure matches consent given Procedure consent: procedure consent matches procedure scheduled Relevant documents: relevant documents present and verified Test results: test results available and properly labeled Site marked: the operative site was marked Imaging studies: imaging studies available Patient identity confirmed: arm band Time out: Immediately prior to procedure a "time out" was called to verify the correct patient, procedure, equipment, support staff and site/side marked as required. Preparation: Patient was prepped and draped in the usual sterile fashion. Local anesthesia used: no  Anesthesia: Local anesthesia used: no  Sedation: Patient sedated: yes Sedation type: moderate (conscious) sedation Sedatives: propofol Sedation start date/time: 06/17/2016 5:15  AM Sedation end date/time: 06/17/2016 5:39 AM Vitals: Vital signs were monitored during sedation. Patient tolerance: Patient tolerated the procedure well with no immediate complications Comments: Traction countertraction maneuver used.  Post reduction film obtained.  Patient is feeling well post reduction    (including critical care time)  Medications Ordered in ED Medications  fentaNYL (SUBLIMAZE) injection 100 mcg (100 mcg Intravenous Given 06/17/16 0441)  ketorolac (TORADOL) 30 MG/ML injection 30 mg (30 mg Intravenous Given 06/17/16 0440)  methocarbamol (ROBAXIN) injection 1,000 mg (1,000 mg Intravenous Given 06/17/16 0441)     Initial Impression / Assessment and Plan / ED Course  I have reviewed the triage vital signs and the nursing notes.  Pertinent labs & imaging results that were available during my care of the patient were reviewed by me and considered in my medical decision making (see chart for details).  Awake and alert post procedure.  Shoulder immobilizer in place.  Wear this 24/7 until cleared by orthopedics.  Call orthopedics this am to schedule a mutually advantageous appointment,   Final Clinical Impressions(s) / ED Diagnoses   New Prescriptions New Prescriptions   No medications on file  All questions answered to patient's satisfaction. Based on history and exam  patient has been appropriately medically screened and emergency conditions excluded. Patient is stable for discharge at this time. Follow up with your PMD for recheck in 2 days and strict return precautions given for chest pain, shortness of breath, hemoptysis, palpitations or any concerns.    Cy BlamerApril Mekaela Azizi, MD 06/17/16 [id number]

## 2016-06-17 NOTE — Sedation Documentation (Signed)
Pt placed on RA

## 2016-06-17 NOTE — Sedation Documentation (Signed)
Oxygen decreased to 4L [place] by Boneta LucksJenny RT.

## 2016-06-17 NOTE — ED Triage Notes (Signed)
Pt reports rolling over in bed and feeling instant pain in right shoulder. Pt appears dislocated.

## 2016-06-17 NOTE — Sedation Documentation (Signed)
Shoulder manipulated by Dr. Nicanor AlconPalumbo and is relocated.

## 2017-04-01 IMAGING — DX DG SHOULDER 2+V*R*
3 series · 3 of 3 positions shown · non-contrast
Comparison: None.

CLINICAL DATA: Right shoulder pain after rolling over in bed.

EXAM:
RIGHT SHOULDER - 2+ VIEW

[shoulder grashey (1 of 2)]
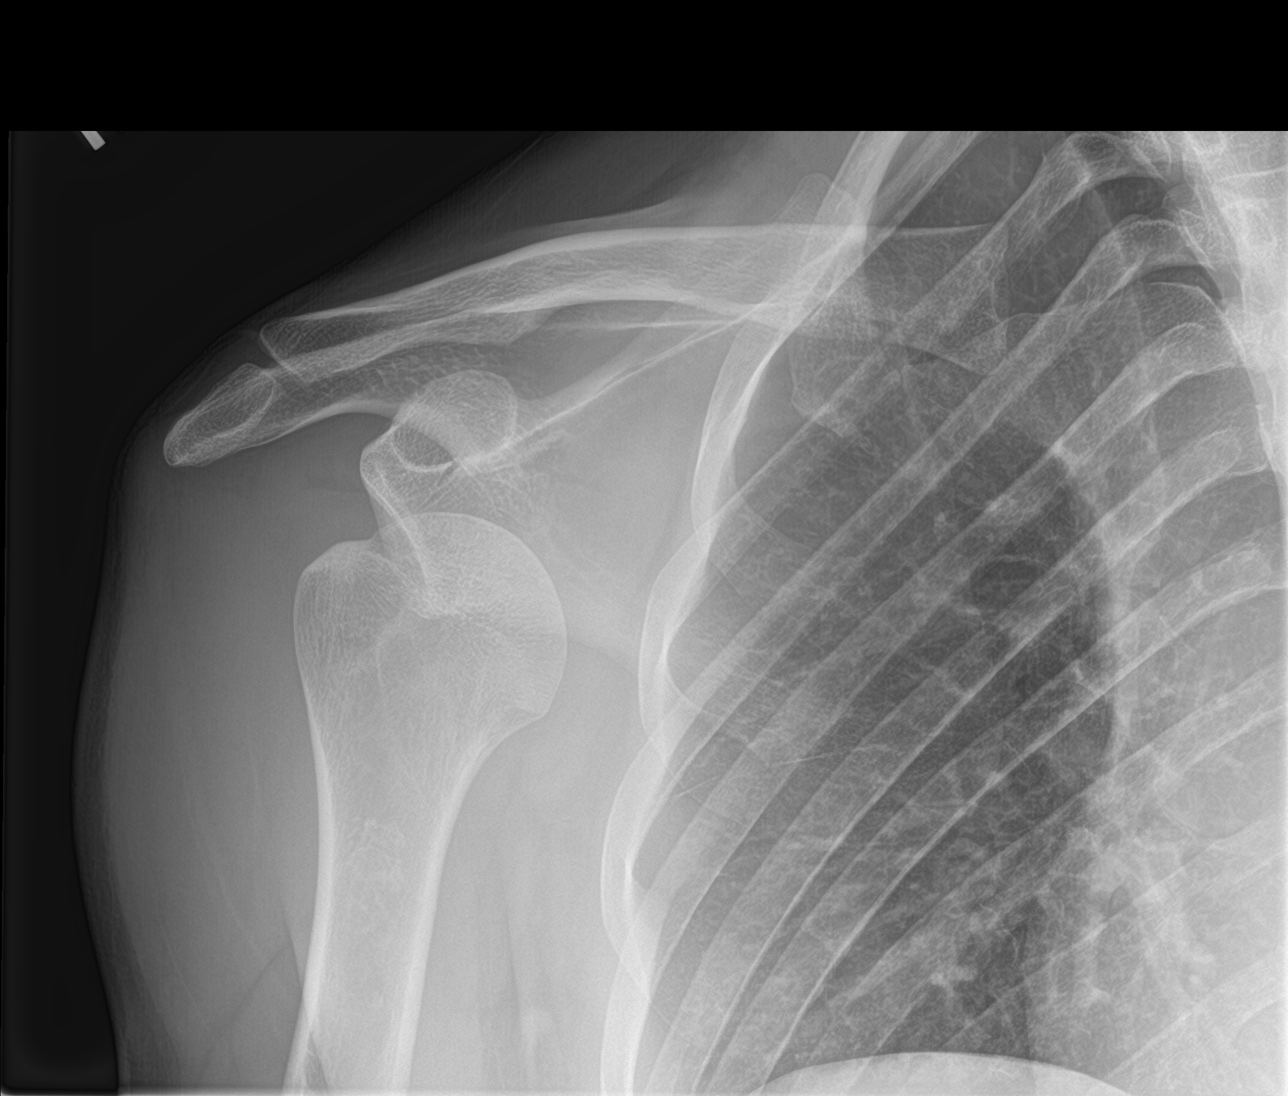

[shoulder y view]
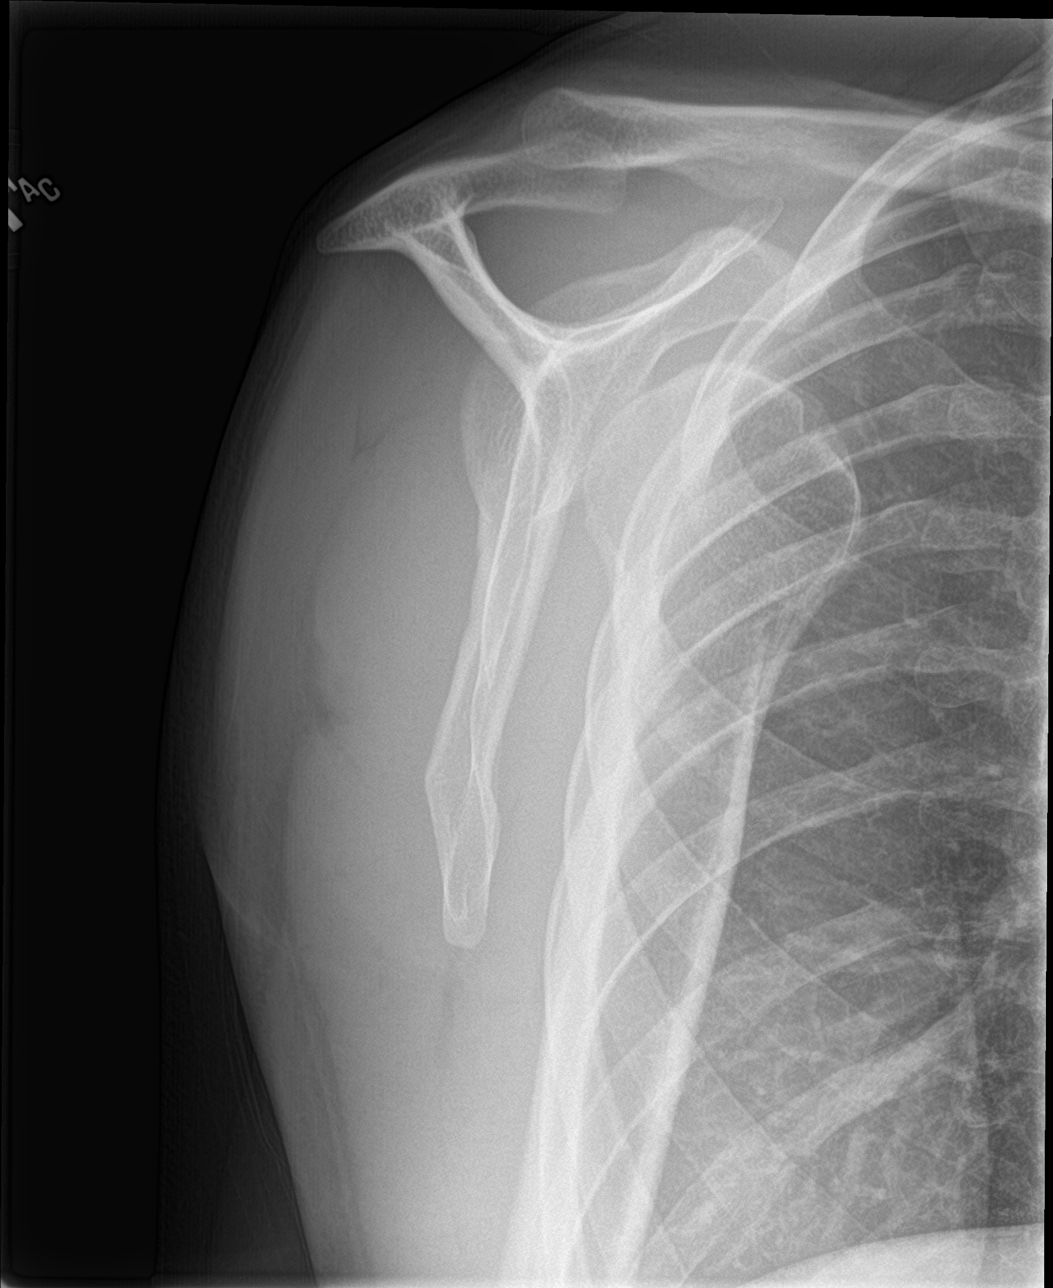

[shoulder grashey (2 of 2)]
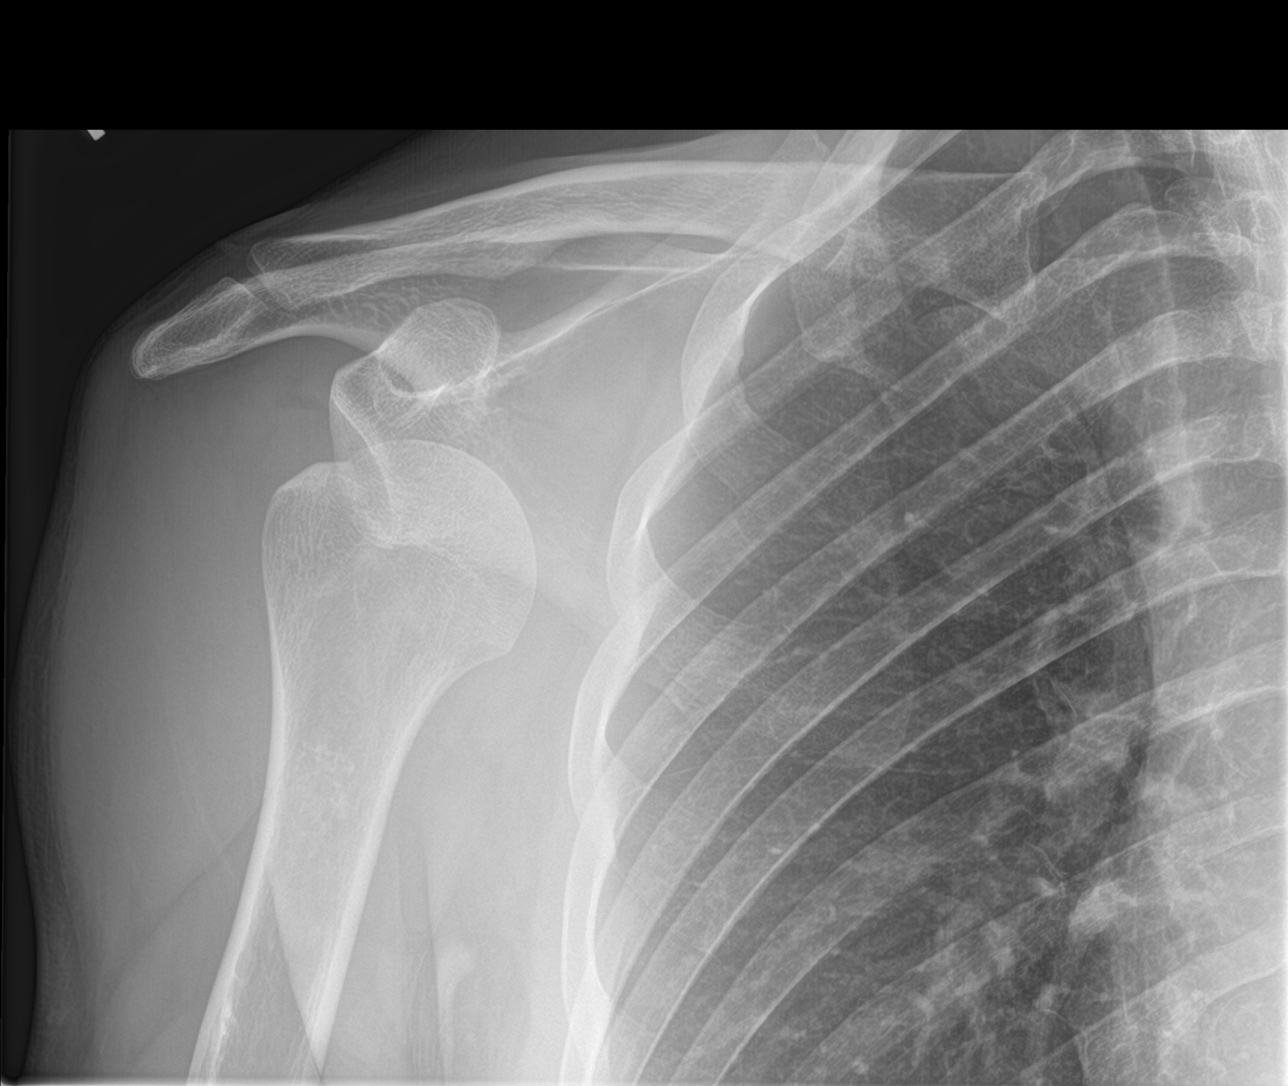

[3 of 3 positions shown; findings below may reference images not displayed]

FINDINGS: Anterior dislocation of the right shoulder with anterior and
inferior subluxation of the humeral head with respect to the
glenoid. Small cortical defect along the lateral humeral head may
represent a Hill-Sachs deformity. Glenoid appears intact.
Coracoclavicular and acromioclavicular spaces are maintained.
IMPRESSION: Anterior dislocation of the right shoulder with probable Hill-Sachs
deformity.
